# Patient Record
Sex: Female | Born: 1960 | Race: White | Marital: Married | Smoking: Former smoker
Health system: Southern US, Community
[De-identification: ages and names within clinical notes are randomized; demographics above are authoritative.]

## PROBLEM LIST (undated history)

## (undated) DIAGNOSIS — E785 Hyperlipidemia, unspecified: Secondary | ICD-10-CM

## (undated) DIAGNOSIS — K219 Gastro-esophageal reflux disease without esophagitis: Secondary | ICD-10-CM

## (undated) DIAGNOSIS — I10 Essential (primary) hypertension: Secondary | ICD-10-CM

## (undated) HISTORY — PX: ABDOMINAL HYSTERECTOMY: SHX81

## (undated) HISTORY — PX: TONSILLECTOMY: SUR1361

## (undated) HISTORY — DX: Gastro-esophageal reflux disease without esophagitis: K21.9

## (undated) HISTORY — DX: Hyperlipidemia, unspecified: E78.5

## (undated) HISTORY — DX: Essential (primary) hypertension: I10

---

## 2018-07-19 ENCOUNTER — Other Ambulatory Visit: Payer: Self-pay | Admitting: Otolaryngology

## 2018-07-19 DIAGNOSIS — H6980 Other specified disorders of Eustachian tube, unspecified ear: Secondary | ICD-10-CM

## 2018-08-03 ENCOUNTER — Ambulatory Visit
Admission: RE | Admit: 2018-08-03 | Discharge: 2018-08-03 | Disposition: A | Discharge status: Home / Self Care | Payer: BC Managed Care – PPO | Source: Ambulatory Visit | Attending: Otolaryngology | Admitting: Otolaryngology

## 2018-08-03 ENCOUNTER — Encounter: Payer: Self-pay | Admitting: Radiology

## 2018-08-03 DIAGNOSIS — H6980 Other specified disorders of Eustachian tube, unspecified ear: Secondary | ICD-10-CM

## 2018-08-03 MED ORDER — GADOBENATE DIMEGLUMINE 529 MG/ML IV SOLN
15.0000 mL | Freq: Once | INTRAVENOUS | Status: AC | PRN
  Administered 2018-08-03: 15 mL via INTRAVENOUS

## 2019-03-31 ENCOUNTER — Encounter: Payer: Self-pay | Admitting: Sports Medicine

## 2019-03-31 ENCOUNTER — Ambulatory Visit (INDEPENDENT_AMBULATORY_CARE_PROVIDER_SITE_OTHER): Payer: BC Managed Care – PPO | Admitting: Sports Medicine

## 2019-03-31 ENCOUNTER — Other Ambulatory Visit: Payer: Self-pay

## 2019-03-31 DIAGNOSIS — M722 Plantar fascial fibromatosis: Secondary | ICD-10-CM | POA: Diagnosis not present

## 2019-03-31 NOTE — Assessment & Plan Note (Signed)
Persistent discomfort in spite of custom orthotics, rehabilitation exercises, injected today. Return in 1 month.

## 2019-03-31 NOTE — Progress Notes (Signed)
Subjective:    CC: Left heel pain  HPI:  For a long time now this pleasant 58 year old female has had pain on the plantar aspect of her left heel, moderate, persistent, localized without radiation.  She has had injections a few years ago with good relief.  She has custom orthotics, she has been doing the rehab exercises and desires repeat interventional treatment today.  I reviewed the past medical history, family history, social history, surgical history, and allergies today and no changes were needed.  Please see the problem list section below in epic for further details.  Past Medical History: Past Medical History:  Diagnosis Date  . GERD (gastroesophageal reflux disease)   . Hyperlipidemia   . Hypertension    Past Surgical History: Past Surgical History:  Procedure Laterality Date  . ABDOMINAL HYSTERECTOMY    . TONSILLECTOMY     Social History: Social History   Socioeconomic History  . Marital status: Married    Spouse name: Not on file  . Number of children: Not on file  . Years of education: Not on file  . Highest education level: Not on file  Occupational History  . Not on file  Social Needs  . Financial resource strain: Not on file  . Food insecurity    Worry: Not on file    Inability: Not on file  . Transportation needs    Medical: Not on file    Non-medical: Not on file  Tobacco Use  . Smoking status: Former Games developermoker  . Smokeless tobacco: Never Used  Substance and Sexual Activity  . Alcohol use: Not Currently  . Drug use: Not on file  . Sexual activity: Not on file  Lifestyle  . Physical activity    Days per week: Not on file    Minutes per session: Not on file  . Stress: Not on file  Relationships  . Social Musicianconnections    Talks on phone: Not on file    Gets together: Not on file    Attends religious service: Not on file    Active member of club or organization: Not on file    Attends meetings of clubs or organizations: Not on file    Relationship  status: Not on file  Other Topics Concern  . Not on file  Social History Narrative  . Not on file   Family History: Family History  Problem Relation Age of Onset  . Inflammatory bowel disease Brother    Allergies: Allergies  Allergen Reactions  . Sulfamethoxazole-Trimethoprim Rash   Medications: See med rec.  Review of Systems: No headache, visual changes, nausea, vomiting, diarrhea, constipation, dizziness, abdominal pain, skin rash, fevers, chills, night sweats, swollen lymph nodes, weight loss, chest pain, body aches, joint swelling, muscle aches, shortness of breath, mood changes, visual or auditory hallucinations.  Objective:    General: Well Developed, well nourished, and in no acute distress.  Neuro: Alert and oriented x3, extra-ocular muscles intact, sensation grossly intact.  HEENT: Normocephalic, atraumatic, pupils equal round reactive to light, neck supple, no masses, no lymphadenopathy, thyroid nonpalpable.  Skin: Warm and dry, no rashes noted.  Cardiac: Regular rate and rhythm, no murmurs rubs or gallops.  Respiratory: Clear to auscultation bilaterally. Not using accessory muscles, speaking in full sentences.  Abdominal: Soft, nontender, nondistended, positive bowel sounds, no masses, no organomegaly.  Left foot: No visible erythema or swelling. Range of motion is full in all directions. Strength is 5/5 in all directions. No hallux valgus. No pes cavus  or pes planus. No abnormal callus noted. No pain over the navicular prominence, or base of fifth metatarsal. Severe tenderness to palpation of the calcaneal insertion of plantar fascia. No pain at the Achilles insertion. No pain over the calcaneal bursa. No pain of the retrocalcaneal bursa. No tenderness to palpation over the tarsals, metatarsals, or phalanges. No hallux rigidus or limitus. No tenderness palpation over interphalangeal joints. No pain with compression of the metatarsal heads. Neurovascularly  intact distally.  Procedure: Real-time Ultrasound Guided injection of the left plantar fascial origin Device: GE Logiq E  Verbal informed consent obtained.  Time-out conducted.  Noted no overlying erythema, induration, or other signs of local infection.  Skin prepped in a sterile fashion.  Local anesthesia: Topical Ethyl chloride.  With sterile technique and under real time ultrasound guidance:  1 cc Kenalog 40, 1 cc lidocaine, 1 cc bupivacaine injected easily Completed without difficulty  Pain immediately resolved suggesting accurate placement of the medication.  Advised to call if fevers/chills, erythema, induration, drainage, or persistent bleeding.  Images permanently stored and available for review in the ultrasound unit.  Impression: Technically successful ultrasound guided injection.  Impression and Recommendations:    The patient was counselled, risk factors were discussed, anticipatory guidance given.  Plantar fasciitis, left Persistent discomfort in spite of custom orthotics, rehabilitation exercises, injected today. Return in 1 month.   ___________________________________________ Gwen Her. Dianah Field, M.D., ABFM., CAQSM. Primary Care and Sports Medicine [facility]  Adjunct Professor of [place] of [facility] of Medicine

## 2019-04-29 ENCOUNTER — Other Ambulatory Visit: Payer: Self-pay

## 2019-04-29 ENCOUNTER — Ambulatory Visit (INDEPENDENT_AMBULATORY_CARE_PROVIDER_SITE_OTHER): Payer: BC Managed Care – PPO | Admitting: Sports Medicine

## 2019-04-29 DIAGNOSIS — M722 Plantar fascial fibromatosis: Secondary | ICD-10-CM | POA: Diagnosis not present

## 2019-04-29 NOTE — Progress Notes (Signed)
Subjective:    CC: Follow-up  HPI: This is a pleasant 58 year old female, we injected her left plantar fascia at the last visit, she is completely pain-free, she has a bit of discomfort around the rim of the calcaneus after a long day on her feet, but otherwise feels good and feels as though she can live with it.  I reviewed the past medical history, family history, social history, surgical history, and allergies today and no changes were needed.  Please see the problem list section below in epic for further details.  Past Medical History: Past Medical History:  Diagnosis Date  . GERD (gastroesophageal reflux disease)   . Hyperlipidemia   . Hypertension    Past Surgical History: Past Surgical History:  Procedure Laterality Date  . ABDOMINAL HYSTERECTOMY    . TONSILLECTOMY     Social History: Social History   Socioeconomic History  . Marital status: Married    Spouse name: Not on file  . Number of children: Not on file  . Years of education: Not on file  . Highest education level: Not on file  Occupational History  . Not on file  Social Needs  . Financial resource strain: Not on file  . Food insecurity    Worry: Not on file    Inability: Not on file  . Transportation needs    Medical: Not on file    Non-medical: Not on file  Tobacco Use  . Smoking status: Former Games developermoker  . Smokeless tobacco: Never Used  Substance and Sexual Activity  . Alcohol use: Not Currently  . Drug use: Not on file  . Sexual activity: Not on file  Lifestyle  . Physical activity    Days per week: Not on file    Minutes per session: Not on file  . Stress: Not on file  Relationships  . Social Musicianconnections    Talks on phone: Not on file    Gets together: Not on file    Attends religious service: Not on file    Active member of club or organization: Not on file    Attends meetings of clubs or organizations: Not on file    Relationship status: Not on file  Other Topics Concern  . Not on  file  Social History Narrative  . Not on file   Family History: Family History  Problem Relation Age of Onset  . Inflammatory bowel disease Brother    Allergies: Allergies  Allergen Reactions  . Sulfamethoxazole-Trimethoprim Rash   Medications: See med rec.  Review of Systems: No fevers, chills, night sweats, weight loss, chest pain, or shortness of breath.   Objective:    General: Well Developed, well nourished, and in no acute distress.  Neuro: Alert and oriented x3, extra-ocular muscles intact, sensation grossly intact.  HEENT: Normocephalic, atraumatic, pupils equal round reactive to light, neck supple, no masses, no lymphadenopathy, thyroid nonpalpable.  Skin: Warm and dry, no rashes. Cardiac: Regular rate and rhythm, no murmurs rubs or gallops, no lower extremity edema.  Respiratory: Clear to auscultation bilaterally. Not using accessory muscles, speaking in full sentences. Left foot: No visible erythema or swelling. Range of motion is full in all directions. Strength is 5/5 in all directions. No hallux valgus. No pes cavus or pes planus. No abnormal callus noted. No pain over the navicular prominence, or base of fifth metatarsal. No tenderness to palpation of the calcaneal insertion of plantar fascia. No pain at the Achilles insertion. No pain over the calcaneal bursa. No  pain of the retrocalcaneal bursa. No tenderness to palpation over the tarsals, metatarsals, or phalanges. No hallux rigidus or limitus. No tenderness palpation over interphalangeal joints. No pain with compression of the metatarsal heads. Neurovascularly intact distally.  Impression and Recommendations:    Plantar fasciitis, left Plantar fascia is essentially pain-free after injection at the last visit, she could be a little more diligent with her custom orthotics. She has a bit of pain around the rim of the calcaneus and somewhat further along the midfoot but this is so mild she feels as  though she can live with it. Continue to avoid barefoot walking. Her husband will give her foot massages 3 times daily. Return as needed.   ___________________________________________ Gwen Her. Dianah Field, M.D., ABFM., CAQSM. Primary Care and Sports Medicine [facility]  Adjunct Professor of [place] of [facility] of Medicine

## 2019-04-29 NOTE — Assessment & Plan Note (Signed)
Plantar fascia is essentially pain-free after injection at the last visit, she could be a little more diligent with her custom orthotics. She has a bit of pain around the rim of the calcaneus and somewhat further along the midfoot but this is so mild she feels as though she can live with it. Continue to avoid barefoot walking. Her husband will give her foot massages 3 times daily. Return as needed.

## 2019-08-02 ENCOUNTER — Encounter: Payer: Self-pay | Admitting: Sports Medicine

## 2019-08-02 ENCOUNTER — Ambulatory Visit (INDEPENDENT_AMBULATORY_CARE_PROVIDER_SITE_OTHER): Payer: BC Managed Care – PPO | Admitting: Sports Medicine

## 2019-08-02 ENCOUNTER — Other Ambulatory Visit: Payer: Self-pay

## 2019-08-02 DIAGNOSIS — M722 Plantar fascial fibromatosis: Secondary | ICD-10-CM | POA: Diagnosis not present

## 2019-08-02 NOTE — Progress Notes (Signed)
Subjective:    CC: Foot pain  HPI: This is a very pleasant 58 year old female, I treated her back in August for plantar fasciitis, she had a temporary response to a plantar fascial injection, now having recurrence of pain, severe, persistent, localized at the plantar fascia origin without radiation.  I reviewed the past medical history, family history, social history, surgical history, and allergies today and no changes were needed.  Please see the problem list section below in epic for further details.  Past Medical History: Past Medical History:  Diagnosis Date  . GERD (gastroesophageal reflux disease)   . Hyperlipidemia   . Hypertension    Past Surgical History: Past Surgical History:  Procedure Laterality Date  . ABDOMINAL HYSTERECTOMY    . TONSILLECTOMY     Social History: Social History   Socioeconomic History  . Marital status: Married    Spouse name: Not on file  . Number of children: Not on file  . Years of education: Not on file  . Highest education level: Not on file  Occupational History  . Not on file  Tobacco Use  . Smoking status: Former Research scientist (life sciences)  . Smokeless tobacco: Never Used  Substance and Sexual Activity  . Alcohol use: Not Currently  . Drug use: Not on file  . Sexual activity: Not on file  Other Topics Concern  . Not on file  Social History Narrative  . Not on file   Social Determinants of Health   Financial Resource Strain:   . Difficulty of Paying Living Expenses: Not on file  Food Insecurity:   . Worried About Charity fundraiser in the Last Year: Not on file  . Ran Out of Food in the Last Year: Not on file  Transportation Needs:   . Lack of Transportation (Medical): Not on file  . Lack of Transportation (Non-Medical): Not on file  Physical Activity:   . Days of Exercise per Week: Not on file  . Minutes of Exercise per Session: Not on file  Stress:   . Feeling of Stress : Not on file  Social Connections:   . Frequency of  Communication with Friends and Family: Not on file  . Frequency of Social Gatherings with Friends and Family: Not on file  . Attends Religious Services: Not on file  . Active Member of Clubs or Organizations: Not on file  . Attends Archivist Meetings: Not on file  . Marital Status: Not on file   Family History: Family History  Problem Relation Age of Onset  . Inflammatory bowel disease Brother    Allergies: Allergies  Allergen Reactions  . Sulfamethoxazole-Trimethoprim Rash   Medications: See med rec.  Review of Systems: No fevers, chills, night sweats, weight loss, chest pain, or shortness of breath.   Objective:    General: Well Developed, well nourished, and in no acute distress.  Neuro: Alert and oriented x3, extra-ocular muscles intact, sensation grossly intact.  HEENT: Normocephalic, atraumatic, pupils equal round reactive to light, neck supple, no masses, no lymphadenopathy, thyroid nonpalpable.  Skin: Warm and dry, no rashes. Cardiac: Regular rate and rhythm, no murmurs rubs or gallops, no lower extremity edema.  Respiratory: Clear to auscultation bilaterally. Not using accessory muscles, speaking in full sentences. Left foot: No visible erythema or swelling. Range of motion is full in all directions. Strength is 5/5 in all directions. No hallux valgus. No pes cavus or pes planus. No abnormal callus noted. No pain over the navicular prominence, or base of  fifth metatarsal. Severe tenderness to palpation of the calcaneal insertion of plantar fascia. No pain at the Achilles insertion. No pain over the calcaneal bursa. No pain of the retrocalcaneal bursa. No tenderness to palpation over the tarsals, metatarsals, or phalanges. No hallux rigidus or limitus. No tenderness palpation over interphalangeal joints. No pain with compression of the metatarsal heads. Neurovascularly intact distally.  Procedure: Real-time Ultrasound Guided injection of the left  plantar fascial origin Device: Samsung HS60  Verbal informed consent obtained.  Time-out conducted.  Noted no overlying erythema, induration, or other signs of local infection.  Skin prepped in a sterile fashion.  Local anesthesia: Topical Ethyl chloride.  With sterile technique and under real time ultrasound guidance: 1 cc Kenalog 40, 1cc lidocaine, 1 cc bupivacaine injected easily Completed without difficulty  Pain immediately resolved suggesting accurate placement of the medication.  Advised to call if fevers/chills, erythema, induration, drainage, or persistent bleeding.  Images permanently stored and available for review in the ultrasound unit.  Impression: Technically successful ultrasound guided injection.  Impression and Recommendations:    Plantar fasciitis, left Last injection was performed in August, now with recurrence of pain, repeat plantar fashion injection, continue rehab, adding the air heel brace. Return to see me in 1 month.    ___________________________________________ [name]. Benjamin Stain, M.D., ABFM., CAQSM. Primary Care and Sports Medicine [facility]  Adjunct Professor of Family Medicine  [employer]

## 2019-08-02 NOTE — Assessment & Plan Note (Signed)
Last injection was performed in August, now with recurrence of pain, repeat plantar fashion injection, continue rehab, adding the air heel brace. Return to see me in 1 month.

## 2019-08-30 ENCOUNTER — Other Ambulatory Visit: Payer: Self-pay

## 2019-08-30 ENCOUNTER — Ambulatory Visit (INDEPENDENT_AMBULATORY_CARE_PROVIDER_SITE_OTHER): Payer: BC Managed Care – PPO | Admitting: Sports Medicine

## 2019-08-30 DIAGNOSIS — M722 Plantar fascial fibromatosis: Secondary | ICD-10-CM | POA: Diagnosis not present

## 2019-08-30 NOTE — Assessment & Plan Note (Addendum)
Cashae returns, she improved considerably after her injection at the last visit, she is consistent with avoiding barefoot walking and wearing her air heel brace. 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 We will continue her current treatment protocol. She can return to see me as needed.

## 2019-08-30 NOTE — Progress Notes (Signed)
    Procedures performed today:    None.  Independent interpretation of tests performed by another provider:   None.  Impression and Recommendations:    Plantar fasciitis, left Tyquasia returns, she improved considerably after her injection at the last visit, she is consistent with avoiding barefoot walking and wearing her air heel brace. 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 She can return to see me as needed.    ___________________________________________ [name]. Benjamin Stain, M.D., ABFM., CAQSM. Primary Care and Sports Medicine [facility]  Adjunct Instructor of Family Medicine  [employer]

## 2019-10-07 ENCOUNTER — Ambulatory Visit: Payer: BC Managed Care – PPO | Attending: Internal Medicine

## 2019-10-07 DIAGNOSIS — Z23 Encounter for immunization: Secondary | ICD-10-CM | POA: Insufficient documentation

## 2019-10-07 NOTE — Progress Notes (Signed)
   Covid-19 Vaccination Clinic  Name:  Lauren Barnett    MRN: [id number] DOB: 01/29/1961  10/07/2019  [name] was observed post Covid-19 immunization for 15 minutes without incidence. She was provided with Vaccine Information Sheet and instruction to access the V-Safe system.   [name] was instructed to call 911 with any severe reactions post vaccine: Marland Kitchen Difficulty breathing  . Swelling of your face and throat  . A fast heartbeat  . A bad rash all over your body  . Dizziness and weakness    Immunizations Administered    Name Date Dose VIS Date Route   Pfizer COVID-19 Vaccine 10/07/2019 11:05 AM 0.3 mL 07/29/2019 Intramuscular   Manufacturer: [employer], [employer]   Lot: [id number]   NDC: [address]-9

## 2019-10-11 ENCOUNTER — Encounter: Payer: Self-pay | Admitting: Sports Medicine

## 2019-10-11 ENCOUNTER — Other Ambulatory Visit: Payer: Self-pay

## 2019-10-11 ENCOUNTER — Ambulatory Visit (INDEPENDENT_AMBULATORY_CARE_PROVIDER_SITE_OTHER): Payer: BC Managed Care – PPO | Admitting: Sports Medicine

## 2019-10-11 DIAGNOSIS — M722 Plantar fascial fibromatosis: Secondary | ICD-10-CM

## 2019-10-11 MED ORDER — TRAMADOL HCL 50 MG PO TABS: 50.0000 mg | ORAL_TABLET | Freq: Three times a day (TID) | ORAL | 0 refills | Status: AC | PRN

## 2019-10-11 NOTE — Progress Notes (Signed)
    Procedures performed today:    None.  Independent interpretation of tests performed by another provider:   None.  Impression and Recommendations:    Plantar fasciitis, left Lauren Barnett returns, she is a pleasant 59 year old female with left plantar fasciitis, initially she had a good improvement after her injection back in December. She had been consistent with avoiding barefoot walking and wearing her air heel brace. Unfortunately she recently took a step and felt a pop along the medial band of the plantar fascia, and subsequently had a severe worsening of pain. I am going to place her in a cast boot, she will do this for 2 weeks, and if still having pain she will continue for an additional 2 weeks for a full month. Adding tramadol for pain relief in the meantime. The like to see her back in 1 month, if still having symptoms we will proceed with PRP percutaneous tenotomy at the plantar fascial origin.     ___________________________________________ [name]. Benjamin Stain, M.D., ABFM., CAQSM. Primary Care and Sports Medicine [place] [facility]  Adjunct Instructor of Family Medicine  [employer]

## 2019-10-11 NOTE — Assessment & Plan Note (Signed)
Lauren Barnett returns, she is a pleasant 58 year old female with left plantar fasciitis, initially she had a good improvement after her injection back in December. She had been consistent with avoiding barefoot walking and wearing her air heel brace. Unfortunately she recently took a step and felt a pop along the medial band of the plantar fascia, and subsequently had a severe worsening of pain. I am going to place her in a cast boot, she will do this for 2 weeks, and if still having pain she will continue for an additional 2 weeks for a full month. Adding tramadol for pain relief in the meantime. The like to see her back in 1 month, if still having symptoms we will proceed with PRP percutaneous tenotomy at the plantar fascial origin.

## 2019-11-01 ENCOUNTER — Ambulatory Visit: Payer: BC Managed Care – PPO | Attending: Internal Medicine

## 2019-11-01 DIAGNOSIS — Z23 Encounter for immunization: Secondary | ICD-10-CM

## 2019-11-01 NOTE — Progress Notes (Signed)
   Covid-19 Vaccination Clinic  Name:  Lauren Barnett    MRN: [id number] DOB: Sep 03, 1960  11/01/2019  [name] was observed post Covid-19 immunization for 15 minutes without incident. She was provided with Vaccine Information Sheet and instruction to access the V-Safe system.   [name] was instructed to call 911 with any severe reactions post vaccine: Marland Kitchen Difficulty breathing  . Swelling of face and throat  . A fast heartbeat  . A bad rash all over body  . Dizziness and weakness   Immunizations Administered    Name Date Dose VIS Date Route   Pfizer COVID-19 Vaccine 11/01/2019  8:21 AM 0.3 mL 07/29/2019 Intramuscular   Manufacturer: [employer], [employer]   Lot: [id number]   NDC: [address]-6

## 2019-11-08 ENCOUNTER — Ambulatory Visit (INDEPENDENT_AMBULATORY_CARE_PROVIDER_SITE_OTHER): Payer: BC Managed Care – PPO | Admitting: Sports Medicine

## 2019-11-08 ENCOUNTER — Encounter: Payer: Self-pay | Admitting: Sports Medicine

## 2019-11-08 ENCOUNTER — Other Ambulatory Visit: Payer: Self-pay

## 2019-11-08 DIAGNOSIS — M722 Plantar fascial fibromatosis: Secondary | ICD-10-CM

## 2019-11-08 NOTE — Assessment & Plan Note (Signed)
Lauren Barnett returns, she is a pleasant 59 year old female, she has left-sided plantar fasciitis, she had good improvement after an injection back in December but then walked and took a misstep, she had an increase in pain, we placed her in a cast boot and she has done well. She will try without the boot for the next several weeks and if increasing pain we will proceed with MRI followed by PRP percutaneous tenotomy if persistent discomfort. I will await her call.

## 2019-11-08 NOTE — Progress Notes (Signed)
    Procedures performed today:    None.  Independent interpretation of notes and tests performed by another provider:   None.  Impression and Recommendations:    Plantar fasciitis, left Lauren Barnett returns, she is a pleasant 59 year old female, she has left-sided plantar fasciitis, she had good improvement after an injection back in December but then walked and took a misstep, she had an increase in pain, we placed her in a cast boot and she has done well. She will try without the boot for the next several weeks and if increasing pain we will proceed with MRI followed by PRP percutaneous tenotomy if persistent discomfort. I will await her call.    ___________________________________________ [name]. Benjamin Stain, M.D., ABFM., CAQSM. Primary Care and Sports Medicine [facility]  Adjunct Instructor of Family Medicine  [employer]

## 2019-12-28 ENCOUNTER — Ambulatory Visit: Payer: BC Managed Care – PPO | Admitting: Sports Medicine

## 2020-01-04 ENCOUNTER — Ambulatory Visit: Payer: BC Managed Care – PPO | Admitting: Sports Medicine

## 2020-01-10 ENCOUNTER — Other Ambulatory Visit: Payer: Self-pay

## 2020-01-10 ENCOUNTER — Ambulatory Visit (INDEPENDENT_AMBULATORY_CARE_PROVIDER_SITE_OTHER): Payer: BC Managed Care – PPO

## 2020-01-10 ENCOUNTER — Encounter: Payer: Self-pay | Admitting: Sports Medicine

## 2020-01-10 ENCOUNTER — Ambulatory Visit (INDEPENDENT_AMBULATORY_CARE_PROVIDER_SITE_OTHER): Payer: BC Managed Care – PPO | Admitting: Sports Medicine

## 2020-01-10 DIAGNOSIS — M722 Plantar fascial fibromatosis: Secondary | ICD-10-CM | POA: Diagnosis not present

## 2020-01-10 DIAGNOSIS — M79672 Pain in left foot: Secondary | ICD-10-CM

## 2020-01-10 DIAGNOSIS — M84375A Stress fracture, left foot, initial encounter for fracture: Secondary | ICD-10-CM | POA: Insufficient documentation

## 2020-01-10 NOTE — Assessment & Plan Note (Signed)
Aries returns, we did a plantar fascia injection back in December, her plantar fasciitis is now resolved.

## 2020-01-10 NOTE — Assessment & Plan Note (Addendum)
Though her plantar fasciitis had resolved after the injection back in December, she has had several months of pain over the dorsal midfoot, approximately over the talonavicular joint. She has significant swelling and tenderness. Due to the persistence of pain in spite of conservative measures we are going to proceed with x-ray and MRI of the left foot. If osteoarthritis I will likely inject the joint, if stress injury we will probably place her in a cast. I am happy to give her some hydrocodone should she call back requesting it.  I personally reviewed the MRI, there does appear to be stress fracture changes at the base of the second metatarsal, radiology is going to addend the report, I do think she needs to be in a cast or at least a postop shoe for about a month, though stress fractures can take 6 to 12 weeks sometimes to heal.

## 2020-01-10 NOTE — Progress Notes (Addendum)
    Procedures performed today:    None.  Independent interpretation of notes and tests performed by another provider:   I personally reviewed the MRI, there does appear to be stress fracture changes at the base of the second metatarsal.  Brief History, Exam, Impression, and Recommendations:    Plantar fasciitis, left Lauren Barnett returns, we did a plantar fascia injection back in December, her plantar fasciitis is now resolved.  Left foot pain Though her plantar fasciitis had resolved after the injection back in December, she has had several months of pain over the dorsal midfoot, approximately over the talonavicular joint. She has significant swelling and tenderness. Due to the persistence of pain in spite of conservative measures we are going to proceed with x-ray and MRI of the left foot. If osteoarthritis I will likely inject the joint, if stress injury we will probably place her in a cast. I am happy to give her some hydrocodone should she call back requesting it.  I personally reviewed the MRI, there does appear to be stress fracture changes at the base of the second metatarsal, radiology is going to addend the report, I do think she needs to be in a cast or at least a postop shoe for about a month, though stress fractures can take 6 to 12 weeks sometimes to heal.    ___________________________________________ Lauren Barnett. Lauren Barnett, M.D., ABFM., CAQSM. Primary Care and Sports Medicine [place] [facility]  Adjunct Instructor of Family Medicine  [employer]

## 2020-01-22 ENCOUNTER — Ambulatory Visit (INDEPENDENT_AMBULATORY_CARE_PROVIDER_SITE_OTHER): Payer: BC Managed Care – PPO

## 2020-01-22 ENCOUNTER — Other Ambulatory Visit: Payer: Self-pay

## 2020-01-22 DIAGNOSIS — M79672 Pain in left foot: Secondary | ICD-10-CM | POA: Diagnosis not present

## 2020-01-24 ENCOUNTER — Ambulatory Visit (INDEPENDENT_AMBULATORY_CARE_PROVIDER_SITE_OTHER): Payer: BC Managed Care – PPO | Admitting: Sports Medicine

## 2020-01-24 ENCOUNTER — Other Ambulatory Visit: Payer: Self-pay

## 2020-01-24 DIAGNOSIS — M84375A Stress fracture, left foot, initial encounter for fracture: Secondary | ICD-10-CM | POA: Diagnosis not present

## 2020-01-24 NOTE — Progress Notes (Signed)
    Procedures performed today:    Short leg walking cast applied  Independent interpretation of notes and tests performed by another provider:   None.  Brief History, Exam, Impression, and Recommendations:    Stress fracture of second metatarsal bone of left foot MRI did show a stress fracture at the base of the second metatarsal, cast placed today, with postop shoe. Return to see me in 1 month for cast removal, and likely transition into postop shoe alone.    ___________________________________________ [name]. Benjamin Stain, M.D., ABFM., CAQSM. Primary Care and Sports Medicine [place] [facility]  Adjunct Instructor of Family Medicine  [employer]

## 2020-01-24 NOTE — Assessment & Plan Note (Signed)
MRI did show a stress fracture at the base of the second metatarsal, cast placed today, with postop shoe. Return to see me in 1 month for cast removal, and likely transition into postop shoe alone.

## 2020-02-02 ENCOUNTER — Telehealth: Payer: Self-pay

## 2020-02-02 NOTE — Telephone Encounter (Signed)
She states she would like to switch back to the boot. Please advise. No openings this week or next.

## 2020-02-02 NOTE — Telephone Encounter (Signed)
I would ask her to ensure that her capillary refill is good when the foot is swollen.  Elevate as much she can when she is not at work.  She having a lot of pain?

## 2020-02-02 NOTE — Telephone Encounter (Signed)
Patient advised and scheduled.  

## 2020-02-02 NOTE — Telephone Encounter (Signed)
Lord have mercy.  Lets set her up with a visit Monday, maybe make a 1:15 slot or first thing in the morning at 8.

## 2020-02-02 NOTE — Telephone Encounter (Signed)
Lauren Barnett reports swelling in her foot that is in a cast. She states she has to walk a lot at work and by the end of the day her foot is swollen. Denies any other symptoms.

## 2020-02-06 ENCOUNTER — Ambulatory Visit (INDEPENDENT_AMBULATORY_CARE_PROVIDER_SITE_OTHER): Payer: BC Managed Care – PPO | Admitting: Sports Medicine

## 2020-02-06 ENCOUNTER — Encounter: Payer: Self-pay | Admitting: Sports Medicine

## 2020-02-06 DIAGNOSIS — M84375D Stress fracture, left foot, subsequent encounter for fracture with routine healing: Secondary | ICD-10-CM | POA: Diagnosis not present

## 2020-02-06 NOTE — Assessment & Plan Note (Signed)
This is a pleasant 59 year old female, she has an MRI confirmed stress fracture at the base of the second metatarsal, we placed a cast approximately 2 weeks ago, she is having some discomfort of the dorsum of her ankle, no pain at the second metatarsal base. She is here hoping to get the cast off, and feels too tight, and I think there is also a degree of claustrophobia. I gave her the option of valving the cast to loosen the tightness, but she preferred to have it completely removed, cast is removed, she still has tenderness over the dorsum of the second metatarsal shaft, she will go back into her postop shoe/boot when she is at work, and return to see me in a month. I did tell her that removing the cast would likely increase the duration of time it would take for the stress fracture to heal. Ultimately 1 to 3 months is not uncommon, at this point we are approximately 1 month out.

## 2020-02-06 NOTE — Progress Notes (Signed)
    Procedures performed today:    None.  Independent interpretation of notes and tests performed by another provider:   None.  Brief History, Exam, Impression, and Recommendations:    Stress fracture of second metatarsal bone of left foot This is a pleasant 59 year old female, she has an MRI confirmed stress fracture at the base of the second metatarsal, we placed a cast approximately 2 weeks ago, she is having some discomfort of the dorsum of her ankle, no pain at the second metatarsal base. She is here hoping to get the cast off, and feels too tight, and I think there is also a degree of claustrophobia. I gave her the option of valving the cast to loosen the tightness, but she preferred to have it completely removed, cast is removed, she still has tenderness over the dorsum of the second metatarsal shaft, she will go back into her postop shoe/boot when she is at work, and return to see me in a month. I did tell her that removing the cast would likely increase the duration of time it would take for the stress fracture to heal. Ultimately 1 to 3 months is not uncommon, at this point we are approximately 1 month out.    ___________________________________________ [name]. Benjamin Stain, M.D., ABFM., CAQSM. Primary Care and Sports Medicine [place] [facility]  Adjunct Instructor of Family Medicine  [employer]

## 2020-02-21 ENCOUNTER — Ambulatory Visit: Payer: BC Managed Care – PPO | Admitting: Sports Medicine

## 2020-03-05 ENCOUNTER — Other Ambulatory Visit: Payer: Self-pay

## 2020-03-05 ENCOUNTER — Encounter: Payer: Self-pay | Admitting: Sports Medicine

## 2020-03-05 ENCOUNTER — Ambulatory Visit (INDEPENDENT_AMBULATORY_CARE_PROVIDER_SITE_OTHER): Payer: BC Managed Care – PPO | Admitting: Sports Medicine

## 2020-03-05 DIAGNOSIS — M84375D Stress fracture, left foot, subsequent encounter for fracture with routine healing: Secondary | ICD-10-CM | POA: Diagnosis not present

## 2020-03-05 NOTE — Assessment & Plan Note (Signed)
Lauren Barnett returns, she is a pleasant 59 year old female with MRI confirmed stress fracture at the base of the second metatarsal, she did about 2 weeks in a cast, could not tolerate it so we transitioned back into her boot, at this point she has finished a full 2 months of treatment, the last 4 weeks were in a boot. She has improvement in her pain, but still with some discomfort mostly on the plantar aspect, she will continue the boot, I did add a couple of scaphoid pads to help support her foot. She understands that this could take up to 3 months to heal.

## 2020-03-05 NOTE — Progress Notes (Signed)
    Procedures performed today:    None.  Independent interpretation of notes and tests performed by another provider:   None.  Brief History, Exam, Impression, and Recommendations:    Stress fracture of second metatarsal bone of left foot Lauren Barnett returns, she is a pleasant 59 year old female with MRI confirmed stress fracture at the base of the second metatarsal, she did about 2 weeks in a cast, could not tolerate it so we transitioned back into her boot, at this point she has finished a full 2 months of treatment, the last 4 weeks were in a boot. She has improvement in her pain, but still with some discomfort mostly on the plantar aspect, she will continue the boot, I did add a couple of scaphoid pads to help support her foot. She understands that this could take up to 3 months to heal.    ___________________________________________ [name]. Benjamin Stain, M.D., ABFM., CAQSM. Primary Care and Sports Medicine [place] [facility]  Adjunct Instructor of Family Medicine  [employer] [facility]

## 2020-04-04 ENCOUNTER — Ambulatory Visit: Payer: BC Managed Care – PPO | Admitting: Sports Medicine

## 2020-04-06 ENCOUNTER — Ambulatory Visit: Payer: BC Managed Care – PPO | Admitting: Sports Medicine

## 2020-04-06 IMAGING — MR MR BRAIN/IAC WO/W
11 of 12 series · 37 of 48 positions shown · IV contrast (15ml Multihance)
Comparison: None.

CLINICAL DATA: BILATERAL hearing loss, worse on the LEFT. Possible
eustachian tube dysfunction.

EXAM:
MRI HEAD WITHOUT AND WITH CONTRAST
TECHNIQUE: Multiplanar, multiecho pulse sequences of the brain and surrounding
structures were obtained without and with intravenous contrast.
CONTRAST:  15mL MULTIHANCE GADOBENATE DIMEGLUMINE 529 MG/ML IV SOLN
Creatinine was obtained on site at [HOSPITAL] at [HOSPITAL].
Results: Creatinine 0.8 mg/dL.

[Series 2: T1 · sagittal · 5.0mm · 0.45mm/px · 3 of 23 slices shown (1 of 4)]
[im 1/23]
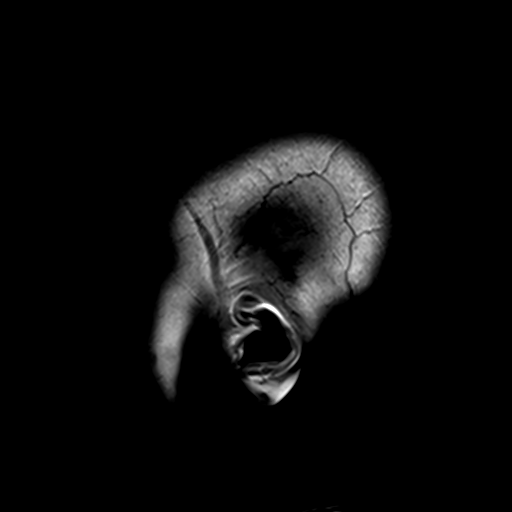
[im 12/23]
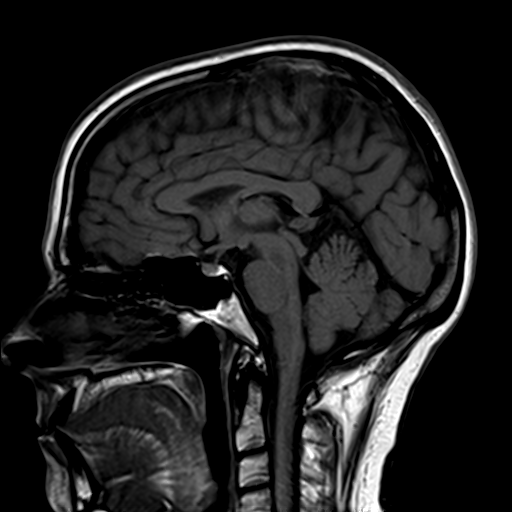
[im 23/23]
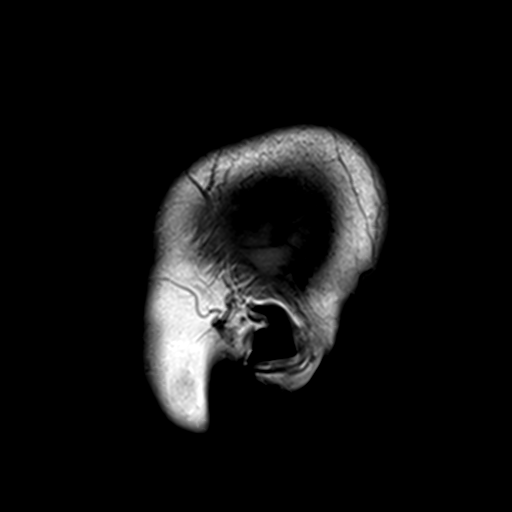

[Series 3: ep2d_diff_(id)_trace · axial · 3.0mm · 1.80mm/px · z∈[-31,+116]mm · 8 of 100 slices shown]
[im 1/100]
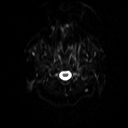
[im 12/100]
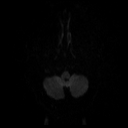
[im 34/100]
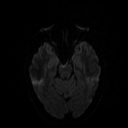
[im 45/100]
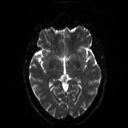
[im 56/100]
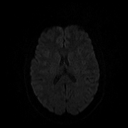
[im 67/100]
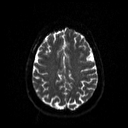
[im 89/100]
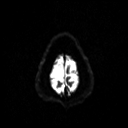
[im 100/100]
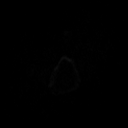

[Series 4: ep2d_diff_(id)_trace_adc · axial · 3.0mm · 1.80mm/px · z∈[-31,+116]mm · 5 of 49 slices shown]
[im 1/49]
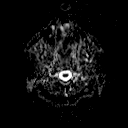
[im 13/49]
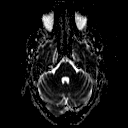
[im 25/49]
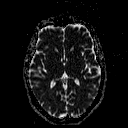
[im 37/49]
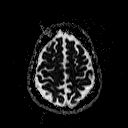
[im 49/49]
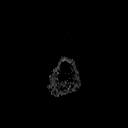

[Series 5: [id number]_tra_512 · axial · 5.0mm · 0.60mm/px · z∈[-36,+120]mm · 3 of 26 slices shown]
[im 1/26]
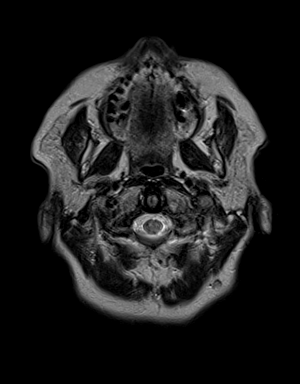
[im 13/26]
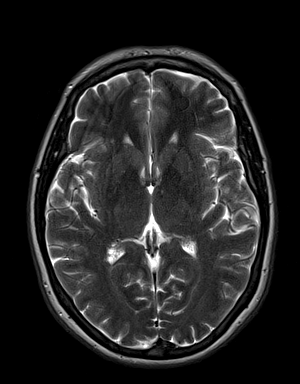
[im 26/26]
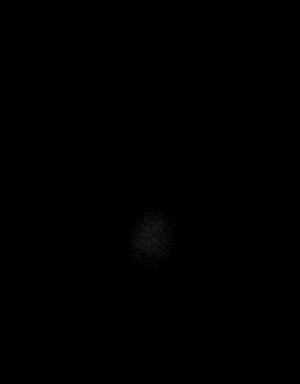

[Series 7: swi_images · axial · 2.0mm · 0.90mm/px · z∈[-36,+121]mm · 8 of 80 slices shown]
[im 1/80]
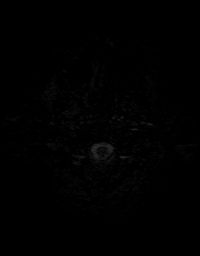
[im 12/80]
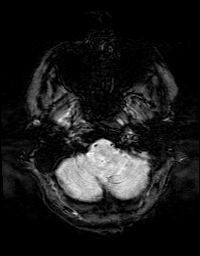
[im 23/80]
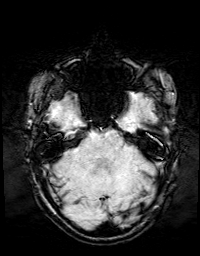
[im 34/80]
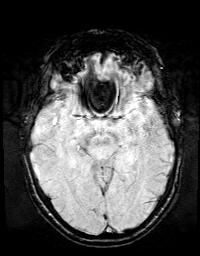
[im 46/80]
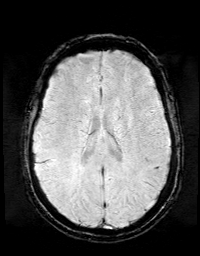
[im 57/80]
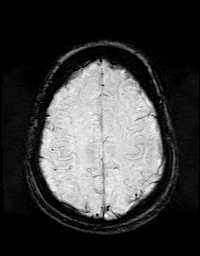
[im 68/80]
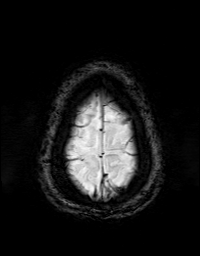
[im 80/80]
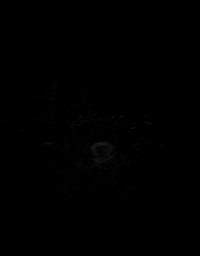

[Series 8: FLAIR · axial · 3.0mm · 0.45mm/px · z∈[-35,+120]mm · 3 of 27 slices shown]
[im 1/27]
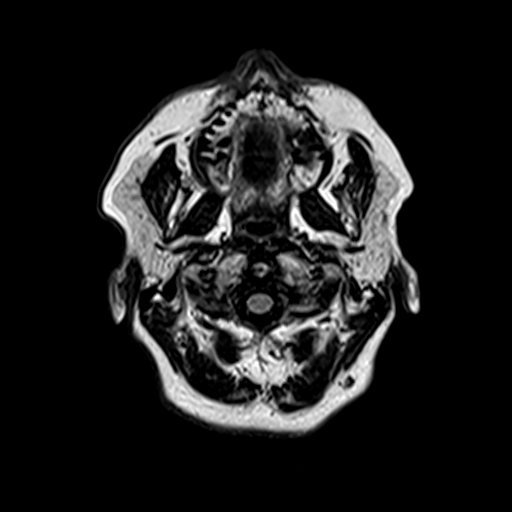
[im 14/27]
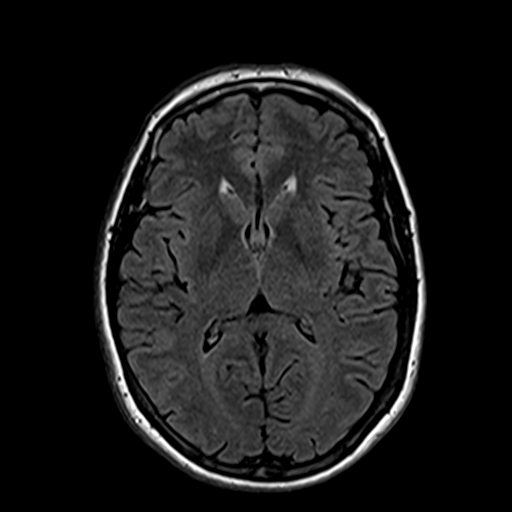
[im 27/27]
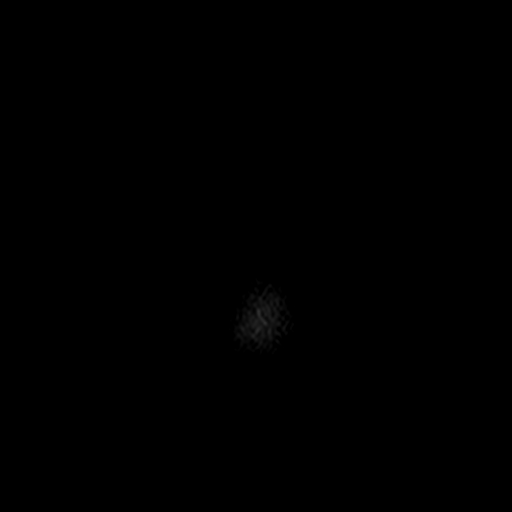

[Series 9: T1 · coronal · 3.0mm · 0.35mm/px · 1 of 13 slices shown (2 of 4)]
[im 1/13]
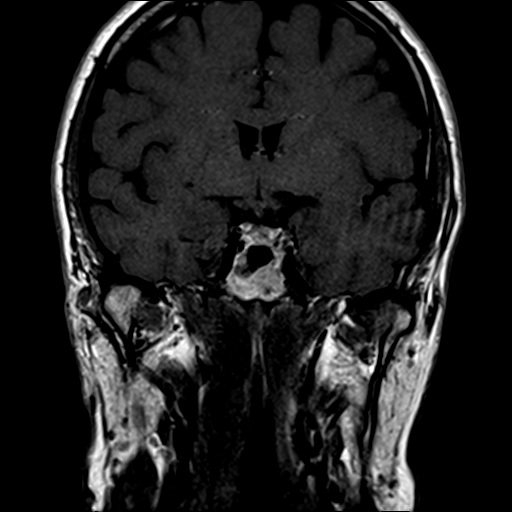

[Series 10: T1 · axial · 3.0mm · 0.35mm/px · 1 of 13 slices shown (3 of 4)]
[im 1/13]
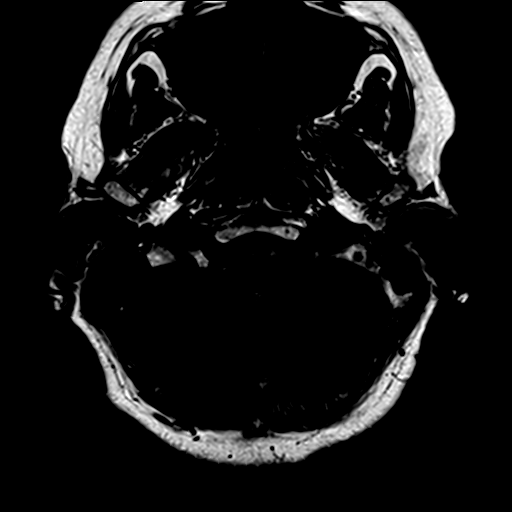

[Series 11: bSSFP · axial · 0.7mm · 0.28mm/px · z∈[-4,+16]mm · 3 of 44 slices shown]
[im 1/44]
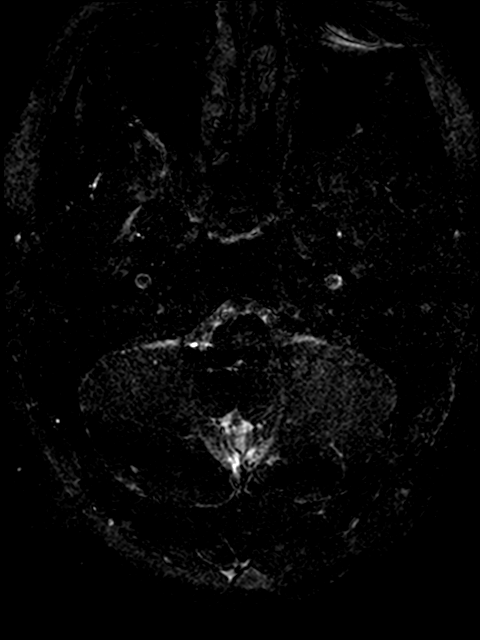
[im 15/44]
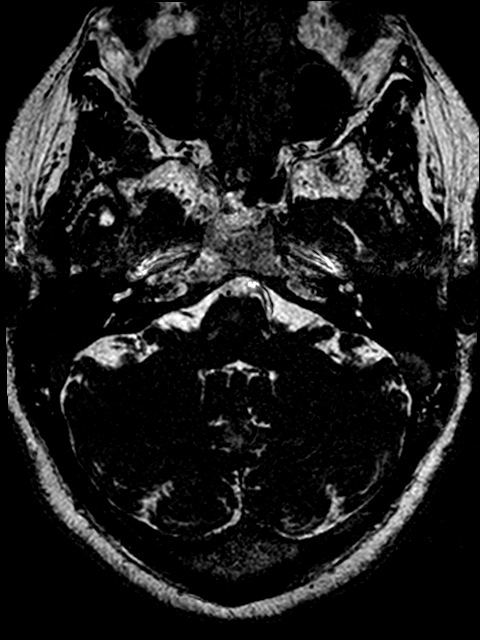
[im 29/44]
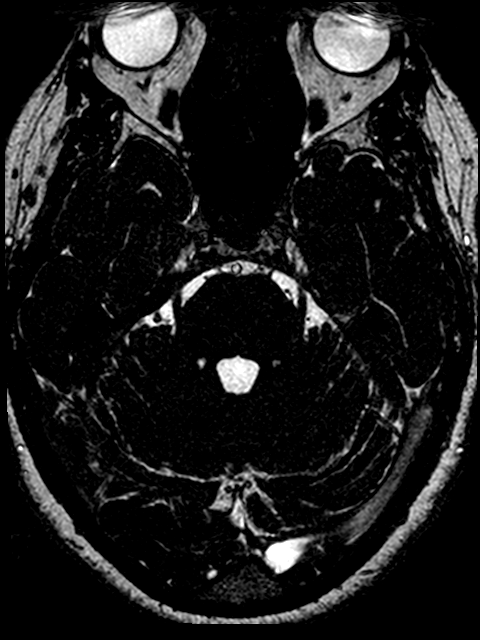

[Series 12: T1 · coronal · 3.0mm · 0.35mm/px · 1 of 13 slices shown (4 of 4)]
[im 1/13]
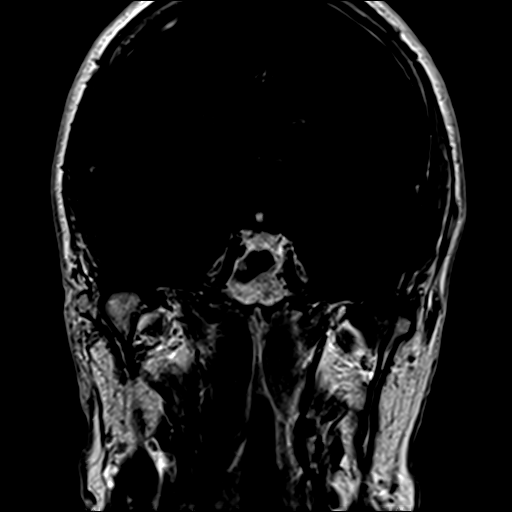

[Series 13: T1 post-contrast · axial · 3.0mm · 0.35mm/px · 1 of 13 slices shown]
[im 1/13]
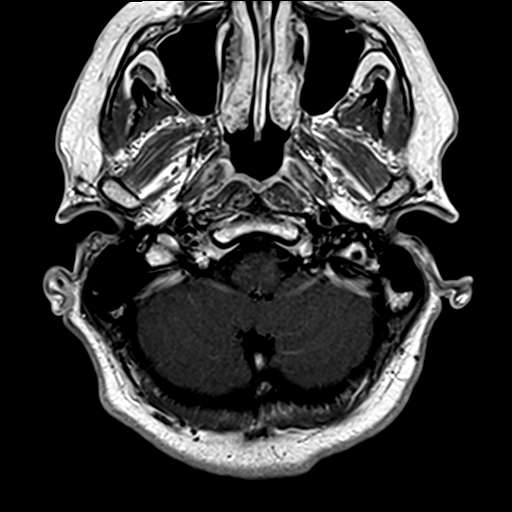

[37 of 48 positions shown; findings below may reference images not displayed]

FINDINGS: Brain: No evidence for acute infarction, hemorrhage, mass lesion,
hydrocephalus, or extra-axial fluid. Normal cerebral volume.

Punctate BILATERAL foci of T2 and FLAIR hyperintensity are seen in
the BILATERAL frontal subcortical white matter, nonspecific. These
are atypical for small vessel disease or demyelinating process.
Considerations could include chronic migraine, vasculitis, or
previous/chronic infection. No accompanying abnormal enhancement.

Thin-section imaging through the posterior fossa reveals no evidence
of vestibular schwannoma or posterior fossa mass. No labyrinthine
enhancement. No vascular loop. No mastoid effusions. No abnormal
white matter foci are seen in the brainstem or cerebellum.

Post infusion, no abnormal enhancement of the brain or meninges.

Vascular: Flow voids are maintained throughout the carotid, basilar,
and vertebral arteries. There are no areas of chronic hemorrhage.

Skull and upper cervical spine: Unremarkable visualized calvarium,
skullbase, and cervical vertebrae. Pituitary, pineal, cerebellar
tonsils unremarkable. No upper cervical cord lesions.

Sinuses/Orbits: No orbital masses or proptosis. Globes appear
symmetric. Sinuses appear well aerated, without evidence for
air-fluid level.

Other: Normal appearing nasopharynx. Scalp soft tissues
unremarkable.
IMPRESSION: MRI of the brain, performed with attention to the auditory pathways,
is unremarkable. No retrocochlear lesion. No visible nasopharyngeal
process. No temporal bone fluid or inflammation.

BILATERAL punctate foci of T2 and FLAIR hyperintensity in the
subcortical white matter of both frontal lobes, are nonspecific. See
discussion above.

## 2020-04-24 ENCOUNTER — Ambulatory Visit (INDEPENDENT_AMBULATORY_CARE_PROVIDER_SITE_OTHER): Payer: BC Managed Care – PPO | Admitting: Sports Medicine

## 2020-04-24 ENCOUNTER — Other Ambulatory Visit: Payer: Self-pay

## 2020-04-24 DIAGNOSIS — M84375D Stress fracture, left foot, subsequent encounter for fracture with routine healing: Secondary | ICD-10-CM

## 2020-04-24 DIAGNOSIS — M722 Plantar fascial fibromatosis: Secondary | ICD-10-CM | POA: Diagnosis not present

## 2020-04-24 NOTE — Progress Notes (Signed)
    Procedures performed today:    None.  Independent interpretation of notes and tests performed by another provider:   None.  Brief History, Exam, Impression, and Recommendations:    Stress fracture of second metatarsal bone of left foot It has now been 2-1/2 to 3 months, Lauren Barnett had an MRI confirmed second metatarsal stress fracture, she is now symptom-free. See previous note for details on treatment process.  Plantar fasciitis, left This was last injected in December 2020, only having a mild recurrence but not bad enough to inject, she will get more diligent with the rehab exercises and continue to wear athletic shoes, return to see me on an as-needed basis.    ___________________________________________ [name]. Benjamin Stain, M.D., ABFM., CAQSM. Primary Care and Sports Medicine [place] [facility]  Adjunct Instructor of Family Medicine  [employer]

## 2020-04-24 NOTE — Assessment & Plan Note (Signed)
It has now been 2-1/2 to 3 months, Lauren Barnett had an MRI confirmed second metatarsal stress fracture, she is now symptom-free. See previous note for details on treatment process.

## 2020-04-24 NOTE — Assessment & Plan Note (Signed)
This was last injected in December 2020, only having a mild recurrence but not bad enough to inject, she will get more diligent with the rehab exercises and continue to wear athletic shoes, return to see me on an as-needed basis.

## 2021-03-13 ENCOUNTER — Ambulatory Visit: Payer: BC Managed Care – PPO | Admitting: Sports Medicine

## 2021-03-20 ENCOUNTER — Ambulatory Visit: Payer: BC Managed Care – PPO | Admitting: Sports Medicine

## 2021-03-27 ENCOUNTER — Ambulatory Visit (INDEPENDENT_AMBULATORY_CARE_PROVIDER_SITE_OTHER): Payer: BC Managed Care – PPO

## 2021-03-27 ENCOUNTER — Ambulatory Visit (INDEPENDENT_AMBULATORY_CARE_PROVIDER_SITE_OTHER): Payer: BC Managed Care – PPO | Admitting: Sports Medicine

## 2021-03-27 DIAGNOSIS — M722 Plantar fascial fibromatosis: Secondary | ICD-10-CM | POA: Diagnosis not present

## 2021-03-27 NOTE — Progress Notes (Signed)
    Procedures performed today:    Procedure: Real-time Ultrasound Guided injection of the right plantar fascia origin Device: Samsung HS60  Verbal informed consent obtained.  Time-out conducted.  Noted no overlying erythema, induration, or other signs of local infection.  Skin prepped in a sterile fashion.  Local anesthesia: Topical Ethyl chloride.  With sterile technique and under real time ultrasound guidance: Noted thickened plantar fascia, 1 cc Kenalog 40, 1 cc lidocaine, 1 cc bupivacaine injected easily Completed without difficulty  Advised to call if fevers/chills, erythema, induration, drainage, or persistent bleeding.  Images permanently stored and available for review in PACS.  Impression: Technically successful ultrasound guided injection.  Independent interpretation of notes and tests performed by another provider:   None.  Brief History, Exam, Impression, and Recommendations:    Plantar fasciitis, bilateral This is a very pleasant 60 year old female, history of left plantar fasciitis injected in December 2020, she has started to have pain in the right foot, plantar aspect, worse in the morning, she has tried stretches, NSAIDs, activity modification without much improvement. Due to her severe pain we did an injection today, adding plantar fascia rehab exercises, and air heel brace, return to see me in a month. We will consider a new set of custom molded orthotics if not sufficiently better at the follow-up visit.    ___________________________________________ [name]. Benjamin Stain, M.D., ABFM., CAQSM. Primary Care and Sports Medicine [facility]  Adjunct Instructor of Family Medicine  [employer]

## 2021-03-27 NOTE — Assessment & Plan Note (Signed)
This is a very pleasant 60 year old female, history of left plantar fasciitis injected in December 2020, she has started to have pain in the right foot, plantar aspect, worse in the morning, she has tried stretches, NSAIDs, activity modification without much improvement. Due to her severe pain we did an injection today, adding plantar fascia rehab exercises, and air heel brace, return to see me in a month. We will consider a new set of custom molded orthotics if not sufficiently better at the follow-up visit.

## 2021-04-24 ENCOUNTER — Ambulatory Visit: Payer: BC Managed Care – PPO | Admitting: Sports Medicine
